# Patient Record
Sex: Female | Born: 1960 | Race: White | Hispanic: No | Marital: Married | Smoking: Never smoker
Health system: Southern US, Community
[De-identification: ages and names within clinical notes are randomized; demographics above are authoritative.]

---

## 1998-11-21 ENCOUNTER — Other Ambulatory Visit: Admission: RE | Admit: 1998-11-21 | Discharge: 1998-11-21 | Payer: Self-pay | Admitting: Obstetrics and Gynecology

## 2001-05-26 ENCOUNTER — Other Ambulatory Visit: Admission: RE | Admit: 2001-05-26 | Discharge: 2001-05-26 | Payer: Self-pay | Admitting: Gynecology

## 2001-05-26 ENCOUNTER — Encounter: Payer: Self-pay | Admitting: Gynecology

## 2001-05-26 ENCOUNTER — Encounter: Admission: RE | Admit: 2001-05-26 | Discharge: 2001-05-26 | Payer: Self-pay | Admitting: Gynecology

## 2002-09-17 ENCOUNTER — Encounter: Payer: Self-pay | Admitting: Gynecology

## 2002-09-17 ENCOUNTER — Encounter: Admission: RE | Admit: 2002-09-17 | Discharge: 2002-09-17 | Payer: Self-pay | Admitting: Gynecology

## 2003-07-11 ENCOUNTER — Other Ambulatory Visit: Admission: RE | Admit: 2003-07-11 | Discharge: 2003-07-11 | Payer: Self-pay | Admitting: Family Medicine

## 2003-10-05 ENCOUNTER — Ambulatory Visit: Admission: RE | Admit: 2003-10-05 | Discharge: 2003-10-05 | Payer: Self-pay | Admitting: *Deleted

## 2005-04-09 ENCOUNTER — Emergency Department (HOSPITAL_COMMUNITY): Admission: EM | Admit: 2005-04-09 | Discharge: 2005-04-09 | Payer: Self-pay | Admitting: Emergency Medicine

## 2005-11-19 ENCOUNTER — Ambulatory Visit: Admission: RE | Admit: 2005-11-19 | Discharge: 2005-11-19 | Payer: Self-pay | Admitting: Pulmonary Disease

## 2008-02-11 ENCOUNTER — Ambulatory Visit: Payer: Self-pay

## 2008-03-04 ENCOUNTER — Ambulatory Visit: Payer: Self-pay

## 2008-03-16 ENCOUNTER — Ambulatory Visit: Payer: Self-pay | Admitting: Oncology

## 2008-10-17 ENCOUNTER — Other Ambulatory Visit: Admission: RE | Admit: 2008-10-17 | Discharge: 2008-10-17 | Payer: Self-pay | Admitting: Obstetrics and Gynecology

## 2010-06-26 ENCOUNTER — Ambulatory Visit (HOSPITAL_COMMUNITY): Admission: RE | Admit: 2010-06-26 | Payer: Self-pay | Source: Home / Self Care | Admitting: Obstetrics & Gynecology

## 2010-09-24 ENCOUNTER — Other Ambulatory Visit: Payer: Self-pay | Admitting: Obstetrics & Gynecology

## 2010-09-24 ENCOUNTER — Ambulatory Visit (HOSPITAL_COMMUNITY)
Admission: RE | Admit: 2010-09-24 | Discharge: 2010-09-25 | Disposition: A | Discharge status: Home / Self Care | Payer: BC Managed Care – HMO | Source: Ambulatory Visit | Attending: Obstetrics & Gynecology | Admitting: Obstetrics & Gynecology

## 2010-09-24 DIAGNOSIS — N8000 Endometriosis of the uterus, unspecified: Secondary | ICD-10-CM | POA: Insufficient documentation

## 2010-09-24 DIAGNOSIS — N920 Excessive and frequent menstruation with regular cycle: Secondary | ICD-10-CM | POA: Insufficient documentation

## 2010-09-24 DIAGNOSIS — Z86718 Personal history of other venous thrombosis and embolism: Secondary | ICD-10-CM | POA: Insufficient documentation

## 2010-09-24 DIAGNOSIS — D252 Subserosal leiomyoma of uterus: Secondary | ICD-10-CM | POA: Insufficient documentation

## 2010-09-24 DIAGNOSIS — N393 Stress incontinence (female) (male): Secondary | ICD-10-CM | POA: Insufficient documentation

## 2010-09-24 LAB — CBC
HCT: 37.5 % (ref 36.0–46.0)
Hemoglobin: 12.4 g/dL (ref 12.0–15.0)
MCH: 30.8 pg (ref 26.0–34.0)
MCHC: 33.1 g/dL (ref 30.0–36.0)
MCV: 93.1 fL (ref 78.0–100.0)
Platelets: 345 10*3/uL (ref 150–400)
RBC: 4.03 MIL/uL (ref 3.87–5.11)
RDW: 12.4 % (ref 11.5–15.5)
WBC: 8 10*3/uL (ref 4.0–10.5)

## 2010-09-24 LAB — PREGNANCY, URINE: Preg Test, Ur: NEGATIVE

## 2010-09-24 LAB — SURGICAL PCR SCREEN
MRSA, PCR: NEGATIVE
Staphylococcus aureus: NEGATIVE

## 2010-09-25 LAB — CBC
HCT: 36.1 % (ref 36.0–46.0)
Platelets: 343 10*3/uL (ref 150–400)
RDW: 12.4 % (ref 11.5–15.5)
WBC: 13.3 10*3/uL — ABNORMAL HIGH (ref 4.0–10.5)

## 2010-10-08 NOTE — Op Note (Signed)
NAME:  JI, [place] NO.:  [id number]  MEDICAL RECORD NO.:  [id number]           PATIENT TYPE:  O  LOCATION:  9311                          FACILITY:  [facility]  PHYSICIAN:  M. Leda Quail, MD  DATE OF BIRTH:  1961-04-15  DATE OF PROCEDURE: DATE OF DISCHARGE:                              OPERATIVE REPORT   PREOPERATIVE DIAGNOSES: 33. A 50 year old G88, P2 married white female with menorrhagia. 2. Adenomyosis. 3. Mild stress urinary incontinence. 4. History of prothrombin gene mutation. 5. Recent C6-C7 diskectomy.  POSTOPERATIVE DIAGNOSES: 68. A 50 year old G37, P2 married white female with menorrhagia. 2. Adenomyosis. 3. Mild stress urinary incontinence. 4. History of prothrombin gene mutation. 5. Recent C6-C7 diskectomy.  PROCEDURE:  [facility] robotic assisted.  SURGEON:  M. Leda Quail, MD  ASSISTANT:  Edwena Felty. Romine, MD and OR staff.  ANESTHESIA:  General endotracheal, Dr. Malen Gauze.  FINDINGS:  Normal uterus, tubes, and ovaries.  SPECIMEN:  Cervix and uterus sent to Pathology.  ESTIMATED BLOOD LOSS:  30 mL.  URINE OUTPUT:  300 mL.  FLUIDS:  1200 mL of LR.  COMPLICATIONS:  None.  INDICATIONS:  Ms. Underhill is a very nice 50 year old G4, P2 married white female with a history of menorrhagia.  She has been considering definitive management with hysterectomy for several months now.  In fact she was posted for surgery back in December and canceled.  She did undergo for evaluation an ultrasound, which showed diffuse adenomyosis of the uterus.  I did not think an ablation was a good option for her. A little over year and a half ago, she had endometrial biopsy that showed proliferative endometrium without any abnormal cells.  She also has a prothrombin gene mutation and cannot be on oral contraceptives. This was discovered after a knee arthroscopy in 2009 resulted in a DVT. She did receive preoperative Lovenox 40 mg.  Her other most recent medical  issue is she had a C6-C7 diskectomy by Dr. Venetia Maxon.  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  Last week her cycle was extremely heavy with clots and essentially dripping blood in the toilet.  I spoke with Dr. Venetia Maxon about her desire to go ahead and proceed with hysterectomy at this time.  The procedure was described to him and he did not have any limitations other than no hyperflexion of the neck with intubation or extubation.  We are going to also try and control her nausea very well because I do not want her to have much emesis if possible.  If she does not, I cannot guarantee this.  Risks and benefits have been discussed with the patient and her husband and they are here and ready to proceed.  PROCEDURE:  The patient was taken to the operating room.  She was placed in a supine position.  Her legs were initially bent to decrease pressure on her low back.  Once she was in a correct positioning on the table and on the beanbag, her legs were positioned in the Ossian stirrups in the high lithotomy position and then lowered to  the low lithotomy position. She was comfortable this entire time.  Her neck was protected very carefully and Dr. Malen Gauze was present when intubation occurred.  A glide scope was used and there was no flexion of the neck that occurred. Intubation well without difficulty.  Her right arm was tucked, her left arm was left out.  Once the correct intubation was confirmed, the abdomen was prepped with ChloraPrep, the inner thighs, vagina, and perineum with Betadine.  3 minutes passed before the patient was draped in a normal sterile fashion.  Legs were lifted to the high lithotomy position.  Attention was turned to the vagina.  A curved Christain [place] was used to retract anteriorly to visualize the cervix.  Anterior lip of the cervix was grasped with single-tooth tenaculum.  The uterus sounds to 9 cm.  A #8 disposable  tip for the Rumi uterine manipulator was obtained. This was placed on the tip of the Rumi manipulator as well as vaginal occlusive device and a medium Koh-ring.  Cervix was dilated up to a #23 and the entire device was passed through cervical canal with the [facility]- ring sitting well around the cervix.  5 mL balloon was inflated to hold the instrument in place.  There was good manipulation of the uterus and the Koh-ring fit around the cervix very well.  Foley catheter was placed to straight drain.  Legs were placed back to the supine position. Before the patient was draped, the bean bag was inflated and butterflied around the shoulders to give good support to the neck and the shoulders. We did place the patient in Trendelenburg without any draping to make sure there was no movement of the table and there was not.  At this point, attention was turned to the abdomen.  Marcaine 0.25% was used to anesthetize above the umbilicus.  A 10-mm skin incision was made with an #11 blade.  Veress needle was obtained.  This was passed through the abdominal cavity without difficulty.  Once peritoneum was popped through, a syringe of normal saline was obtained.  An aspiration was performed.  No blood or fluid was noted.  Fluid was injected and aspiration was performed again.  Then under low flow CO2 gas, pneumoperitoneum was achieved.  Using an #11, 12-bladed trocar, the trocar port passed through the midline without difficulty.  The trocar was removed.  The laparoscope was then used to visualize the pelvis. Port sites were chosen for the 1 and 2 arm of the robot.  These were placed 10 cm lateral to the umbilical incision with an assistance port in the right lower quadrant.  The incisions were numbed with Marcaine and then #11 blade was used to nick the skin at all 3 locations.  Two non-disposable #8 trocar ports were placed and #1 and 2 positioning 10 mm to the right and left of the umbilicus.  Then a 12  disposable non- bladed trocar port placed in the right lower quadrant.  The trocars were removed.  The patient was placed in Trendelenburg.  As soon as the bowel was scooped up out of the pelvis, the Trendelenburg positioning was stopped.  She was in Trendelenburg positioning, but she was not in maximal Trendelenburg.  I did not feel that was necessary for this case and had excellent visualization.  The robot was side docked.  The arms were attached.  An endoscopic scissors with monopolar cautery was placed in arm 1 and bipolar cautery with PK [place] was placed in arm 2.  The  surgeon left the table at this time and went to consult.  Pelvis was surveyed.  Ureters were noted bilaterally.  Starting on the left side, the utero-ovarian pedicle was serially clamped, cauterized, and incised. The left round ligament was serially clamped, cauterized, and incised. The anterior and posterior leaf of the broad ligament was opened and posterior leaf was taken down to the level of the uterosacral ligaments and the anterior leaf was opened across the level of the internal os. The patient's bladder flap was begun anteriorly.  The filmy tissue between the bladder and the cervix noted.  Once it was pushed down the cervix, a white listening tissue of the cervix was noted.  The small bleeders were made hemostatic with monopolar cautery.  The left uterine artery was skeletonized and then at the level of the Koh-ring was serially clamped, back bleeding was clamped and cauterized, and then the uterine artery on left was incised.  In a similar fashion, the right utero-ovarian pedicle was serially clamped, cauterized, and incised. The right round ligament was serially clamped, cauterized, and incised. The anterior and posterior broad ligament was opened.  This was carried to the midline and the bladder flap was completed.  Posterior leaflet was opened down to the level of the uterosacral ligament.  The  uterine artery was skeletonized.  The level of Koh-ring was serially clamped and cauterized and ultimately incised.  Then a colpotomy was performed starting at about 2 o'clock positioning on the cervix working clockwise around the cervix completing the incision with monopolar cautery.  The uterus and cervix were delivered through the vagina.  The vagina was closed with four figure-of-eight sutures of #0 Vicryl.  To close the cuff the 1 and 2 arm instruments were changed to a suture cut needle driver at progress.  At this point, the pelvis was irrigated.  No bleeding was noted.  All pedicles were inspected.  The ureters were noted to peristalses laterally.  An Interceed was placed across the vaginal cuff.  All instruments were removed under direct visualization of the laparoscope.  The robot was undocked and backed away from the table.  The pneumoperitoneum was relieved after the ports were all removed under direct visualization of the laparoscope.  The patient was placed back in supine position.  Several deep breaths were given from the CRNA to help decompress the abdomen.  The fascia was closed at the midline and the assistant's port with #0- Vicryl.  The skin was then closed with subcuticular stitch of #0 Vicryl.  The incisions were cleansed and dried and Dermabond was placed.  The specimen was removed from vagina and a swipe of a sponge stick with a small amount of blood with a little small clot, but nothing active.  Betadine was cleansed off the patient's skin.  The drapes were removed.  She was awakened from anesthesia without difficulty.  The extubation occurred without any problems.  Again there was no hyperflexion of the neck with this. Sponge, lap, needle, and instrument counts were correct x2.  She was placed in supine position.  She was taken to the recovery room in stable condition.  Before transferring her to the recovery room, her Foley catheter was removed.     Lum Keas, MD     MSM/MEDQ  D:  09/24/2010  T:  09/25/2010  Job:  [id number]  cc:   Danae Orleans. Venetia Maxon, M.D. Fax: [phone]  Electronically Signed by Paul Half MD on 10/08/2010 10:19:00 AM

## 2011-01-23 ENCOUNTER — Other Ambulatory Visit (HOSPITAL_COMMUNITY): Payer: Self-pay | Admitting: Neurosurgery

## 2011-01-23 DIAGNOSIS — M542 Cervicalgia: Secondary | ICD-10-CM

## 2011-02-27 ENCOUNTER — Other Ambulatory Visit (HOSPITAL_COMMUNITY): Payer: BC Managed Care – HMO

## 2011-02-27 ENCOUNTER — Other Ambulatory Visit (HOSPITAL_COMMUNITY): Payer: Self-pay | Admitting: Neurosurgery

## 2011-02-27 DIAGNOSIS — M542 Cervicalgia: Secondary | ICD-10-CM

## 2011-02-28 ENCOUNTER — Ambulatory Visit (HOSPITAL_COMMUNITY)
Admission: RE | Admit: 2011-02-28 | Discharge: 2011-02-28 | Disposition: A | Discharge status: Home / Self Care | Payer: BC Managed Care – HMO | Source: Ambulatory Visit | Attending: Neurosurgery | Admitting: Neurosurgery

## 2011-02-28 DIAGNOSIS — M542 Cervicalgia: Secondary | ICD-10-CM

## 2011-02-28 DIAGNOSIS — M5090 Cervical disc disorder, unspecified, unspecified cervical region: Secondary | ICD-10-CM | POA: Insufficient documentation

## 2011-02-28 DIAGNOSIS — M47812 Spondylosis without myelopathy or radiculopathy, cervical region: Secondary | ICD-10-CM | POA: Insufficient documentation

## 2011-08-13 ENCOUNTER — Other Ambulatory Visit (HOSPITAL_COMMUNITY): Payer: Self-pay | Admitting: Neurosurgery

## 2011-08-13 DIAGNOSIS — M542 Cervicalgia: Secondary | ICD-10-CM

## 2011-08-30 ENCOUNTER — Ambulatory Visit (HOSPITAL_COMMUNITY)
Admission: RE | Admit: 2011-08-30 | Discharge: 2011-08-30 | Disposition: A | Discharge status: Home / Self Care | Payer: Self-pay | Source: Ambulatory Visit | Attending: Neurosurgery | Admitting: Neurosurgery

## 2011-08-30 DIAGNOSIS — Z981 Arthrodesis status: Secondary | ICD-10-CM | POA: Insufficient documentation

## 2011-08-30 DIAGNOSIS — M542 Cervicalgia: Secondary | ICD-10-CM | POA: Insufficient documentation

## 2012-11-25 ENCOUNTER — Telehealth: Payer: Self-pay | Admitting: Hematology & Oncology

## 2012-11-25 NOTE — Telephone Encounter (Signed)
Pt called wanting to see Dr. Myna Hidalgo. She saw Dr. Truett Perna 5 years ago. She is having surgery 01-13-13 and needs clearance. She said her MD is going to fax referral. She is aware to call me by Friday if she hasn't heard from me.

## 2012-11-26 ENCOUNTER — Telehealth: Payer: Self-pay | Admitting: Hematology & Oncology

## 2012-11-26 NOTE — Telephone Encounter (Signed)
Left pt message to call and schedule appointment °

## 2012-11-27 ENCOUNTER — Telehealth: Payer: Self-pay | Admitting: Hematology & Oncology

## 2012-11-27 NOTE — Telephone Encounter (Signed)
Left pt message to call and schedule appointment °

## 2012-11-27 NOTE — Telephone Encounter (Signed)
Faxed unable to reach pt to schedule appointment to Dr. Benjaman Pott office

## 2012-12-28 ENCOUNTER — Other Ambulatory Visit (HOSPITAL_BASED_OUTPATIENT_CLINIC_OR_DEPARTMENT_OTHER): Payer: Managed Care, Other (non HMO) | Admitting: Lab

## 2012-12-28 ENCOUNTER — Ambulatory Visit: Payer: Managed Care, Other (non HMO)

## 2012-12-28 ENCOUNTER — Ambulatory Visit (HOSPITAL_BASED_OUTPATIENT_CLINIC_OR_DEPARTMENT_OTHER): Payer: Managed Care, Other (non HMO) | Admitting: Hematology & Oncology

## 2012-12-28 VITALS — BP 122/60 | HR 64 | Temp 98.1°F | Resp 16 | Ht 68.0 in | Wt 199.0 lb

## 2012-12-28 DIAGNOSIS — I82409 Acute embolism and thrombosis of unspecified deep veins of unspecified lower extremity: Secondary | ICD-10-CM

## 2012-12-28 DIAGNOSIS — I82402 Acute embolism and thrombosis of unspecified deep veins of left lower extremity: Secondary | ICD-10-CM

## 2012-12-28 LAB — CBC WITH DIFFERENTIAL (CANCER CENTER ONLY)
Eosinophils Absolute: 0.2 10*3/uL (ref 0.0–0.5)
MONO#: 0.5 10*3/uL (ref 0.1–0.9)
NEUT#: 2.4 10*3/uL (ref 1.5–6.5)
Platelets: 283 10*3/uL (ref 145–400)
RBC: 4.16 10*6/uL (ref 3.70–5.32)
WBC: 5.7 10*3/uL (ref 3.9–10.0)

## 2012-12-28 MED ORDER — RIVAROXABAN 10 MG PO TABS: 10.0000 mg | ORAL_TABLET | Freq: Every day | ORAL | Status: DC

## 2012-12-28 NOTE — Progress Notes (Signed)
This office note has been dictated.

## 2012-12-29 NOTE — Progress Notes (Signed)
CC:   Robert A. Thurston Hole, M.D. Robert A. Nicholos Johns, M.D.  DIAGNOSES: 1. Prothrombin gene mutation -  heterozygous. 2. Patient to need left knee replacement.  HISTORY OF PRESENT ILLNESS:  Ms. Forbush is a very charming 52 year old white female.  She has a history of a prothrombin II gene mutation.  She actually had a DVT after a left knee surgery back in August of 2009. She had a left lower extremity DVT.  Again, she is heterozygous for the prothrombin gene mutation.  This is in her family.  She has two daughters who have it.  I think a niece has it. Her mother also has it. Ms. Schaffert has also had two miscarriages.  Again, she was placed on Coumadin for 3 months.  She did not like being on Coumadin.  In 2012, she had a neck fusion and also hysterectomy.  She was on Lovenox after these procedures.  She now needs to have a total left knee replacement.  Robert A. Thurston Hole, M.D. will be doing this.  It may be set up for September.  Ms. Lansdale just wanted to make sure that everything was okay so that she has a minimal risk of blood clot after the procedure.  Back in 2009, she underwent a fairly extensive hypercoagulable workup that was all negative, outside of the prothrombin II gene mutation.  At that time, she had a left a gastrocnemius vein thrombus.  She has not been able to exercise that much because of the left knee. 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  She says that this bothers her knee.  She is not on aspirin.  I think she needs to be on baby aspirin.  I told to take 81 mg a day of baby aspirin. I also told her to take folic acid.  She has had no bleeding.  She had a hysterectomy but had her ovaries left in place.  She is postmenopausal.  She does have a bulging disk in her lower back.  She is going to see a chiropractor for this.  I do not have any problems with this.  She has had no rashes.  She does not smoke.  She has had no headache.  There has been no cough or  shortness of breath.  PAST MEDICAL HISTORY:  Unremarkable outside of multiple surgeries for orthopedic issues.  She also has had the hysterectomy.  She has had two miscarriages.  ALLERGIES: 1. Codeine. 2. Pseudoephedrine.  MEDICATIONS:  Flexeril 10 mg p.o. every 8 hours p.r.n.  Norco (10/325) 1 p.o. every 12 hours p.r.n., Naprosyn 250 mg p.o. daily p.r.n., Advil as needed for sleep.  SOCIAL HISTORY:  Negative for tobacco use.  She has rare alcohol use. There is no obvious occupational exposures.  Again, she is a traveling Engineer, civil (consulting).  She sees patients with multiple sclerosis.  FAMILY HISTORY:  Remarkable for her mother having had blood clot, miscarriages.  Her two daughters have the prothrombin gene mutation.  REVIEW OF SYSTEMS:  As stated in history present illness.  No additional findings are noted on 12-system review.  PHYSICAL EXAMINATION:  General:  This is a well-developed, well- nourished white female in no obvious distress.  Vital signs: Temperature of 98.1, pulse 64, respiratory rate 16, blood pressure 122/60.  Weight is 199.  Head and neck:  Normocephalic, atraumatic skull.  There are no ocular or oral lesions.  There are no palpable cervical or supraclavicular lymph nodes.  Lungs:  Clear bilaterally. Cardiac:  Regular  rate and rhythm with a normal S1 and S2.  There are no murmurs, rubs, or bruits.  Abdomen:  Soft with good bowel sounds.  There is no palpable abdominal mass.  There is no fluid wave.  There is no palpable hepatosplenomegaly.  Back:  No tenderness over the spine, ribs, or hips.  Extremities:  Show no clubbing, cyanosis or edema.  There may be some slight swelling of the left knee.  She has good pulses in her distal extremities.  No palpable venous cords are noted in her legs. Skin:  No rashes, ecchymosis, or petechia.  Neurological:  Shows no focal neurological deficits.  LABORATORY STUDIES:  White cell count 5.7, hemoglobin 13.3, hematocrit 39.6,  platelet count 283.  IMPRESSION:  Ms. Lievanos is a very charming 52 year old white female with a history of prothrombin II gene mutation.  She did have a deep venous thrombosis of the left lower leg after knee surgery back in 2009.  She definitely will need postoperative anticoagulation.  However, I do not think we need to put her on full-dose anticoagulation.  Now that we have Xarelto, I think this would be a good way to try to treat her postoperatively.  I believe 10 mg a day would be appropriate for her.  I probably would keep her on the Xarelto for 6 weeks after surgery so that she is able to ambulate well.  I do not think she needs any anticoagulation preop.  Again, I do believe that she needs to be on baby aspirin.  I told her to take 81 mg a day.  I told her to stop this 5 days before surgery.  Ms. Medinger thinks that she will be able to have this surgery in September. We will hopefully have this done.  I told her to let us know about when her surgery will be.  As such, we can then get her back a month or so afterwards for followup.  I spent about an hour with this lady.  Again, she is very, very nice.    ______________________________ Josph Macho, M.D. PRE/MEDQ  D:  12/28/2012  T:  12/29/2012  Job:  [id number]

## 2013-01-04 ENCOUNTER — Other Ambulatory Visit (HOSPITAL_COMMUNITY): Payer: Self-pay

## 2013-01-11 ENCOUNTER — Inpatient Hospital Stay: Admit: 2013-01-11 | Payer: Self-pay | Admitting: Orthopedic Surgery

## 2013-01-11 SURGERY — ARTHROPLASTY, KNEE, TOTAL
Anesthesia: General | Laterality: Left

## 2013-03-01 ENCOUNTER — Inpatient Hospital Stay: Admit: 2013-03-01 | Payer: Managed Care, Other (non HMO) | Admitting: Orthopedic Surgery

## 2013-03-01 SURGERY — ARTHROPLASTY, KNEE, TOTAL
Anesthesia: General | Laterality: Left

## 2013-03-05 IMAGING — CT CT CERVICAL SPINE W/O CM
4 series · 17 of 33 positions shown, 19 images · non-contrast
Comparison: 02/28/2011

CLINICAL DATA: [employer] cervical spine protocol.  Surgery 1 year
ago.

CT CERVICAL SPINE WITHOUT CONTRAST
TECHNIQUE: Multidetector CT imaging of the cervical spine was
performed. Multiplanar CT image reconstructions were also
generated.

[Series 2: alphatec c spine · axial · 0.31mm/px · z∈[-122,-26]mm · 6 of 257 slices shown]
[im 26/257  bone]
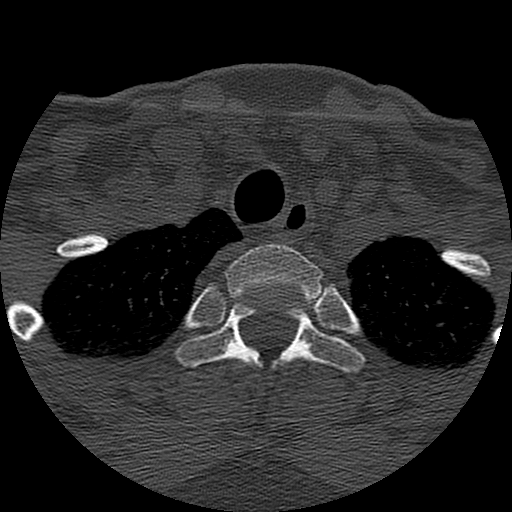
[im 52/257  bone]
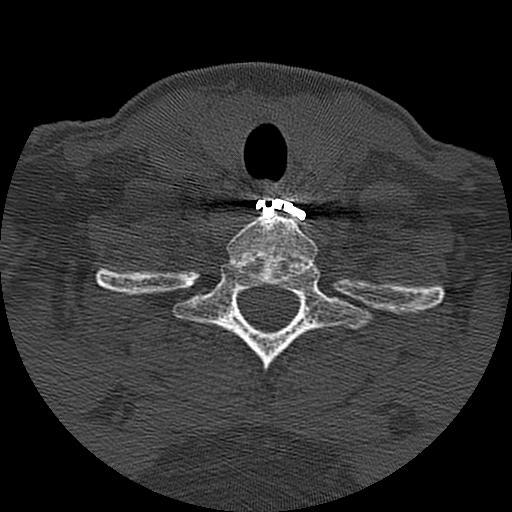
[im 77/257  bone]
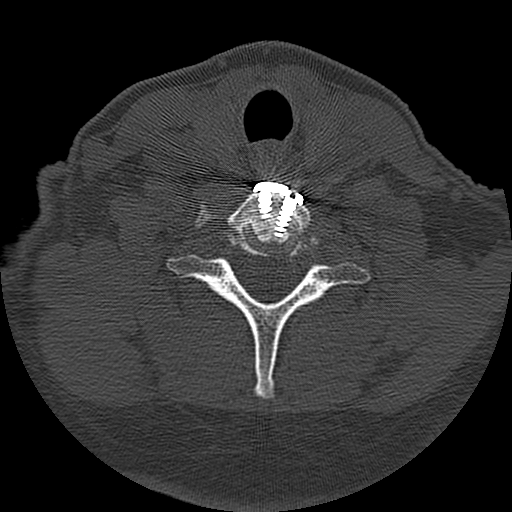
[im 103/257  bone]
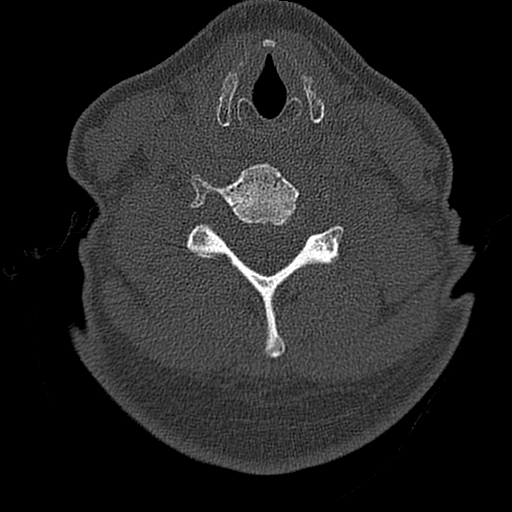
[im 154/257  bone]
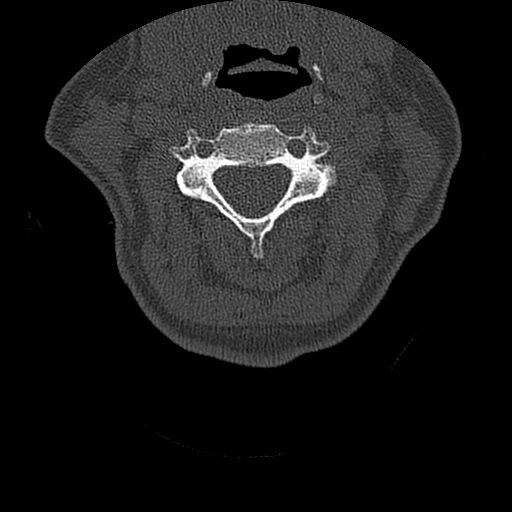
[im 180/257  bone]
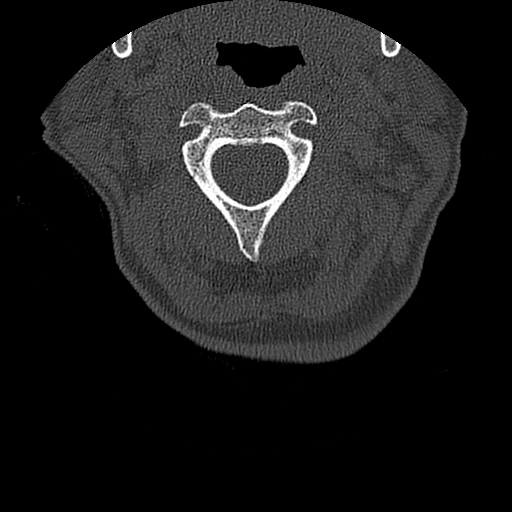

[Series 200: reformatted axials · axial · 0.32mm/px · z∈[-163,+30]mm · 3 of 82 slices shown, 4 images]
[im 1/82  soft-tissue]
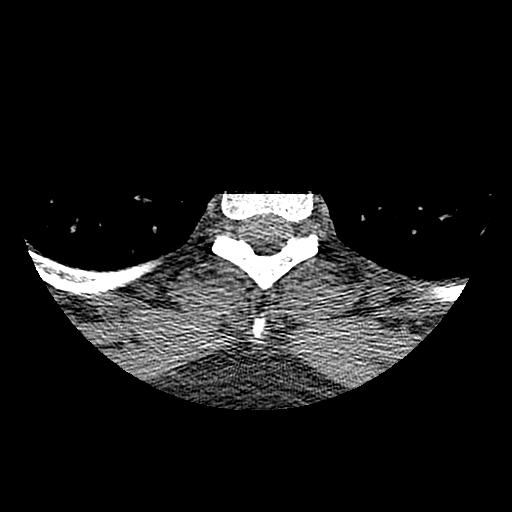
[im 1/82  bone]
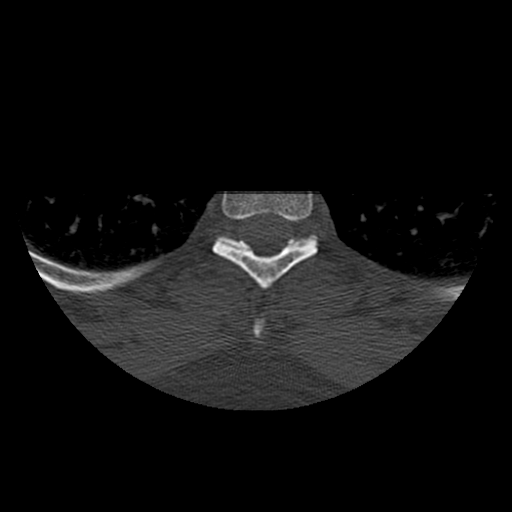
[im 41/82  bone]
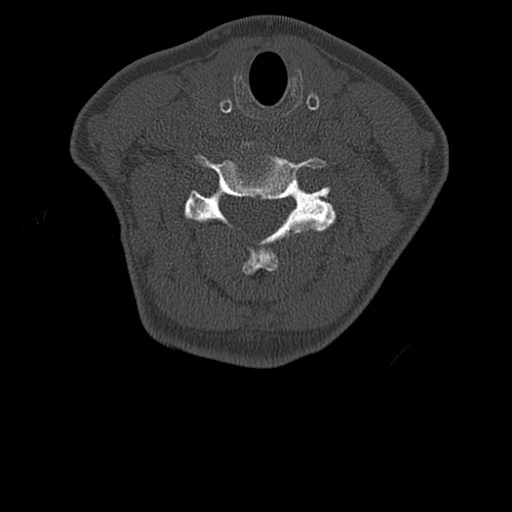
[im 82/82  bone]
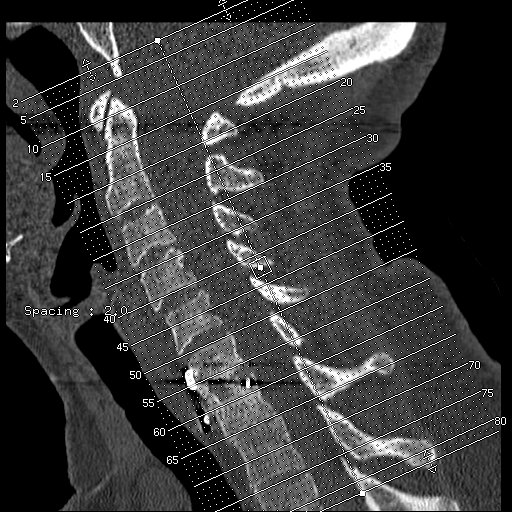

[Series 201: coronals · coronal · 0.32mm/px · 3 of 38 slices shown]
[im 8/38  bone]
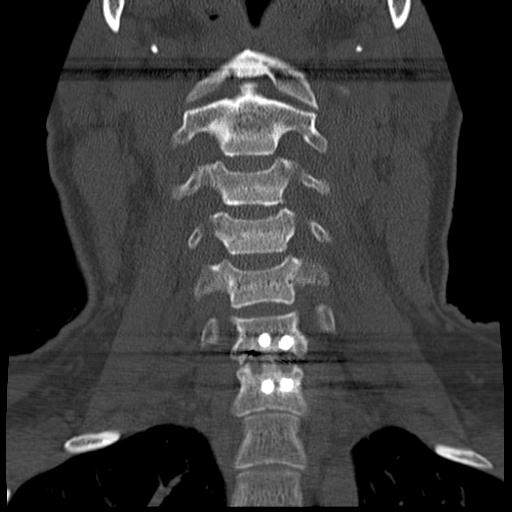
[im 15/38  bone]
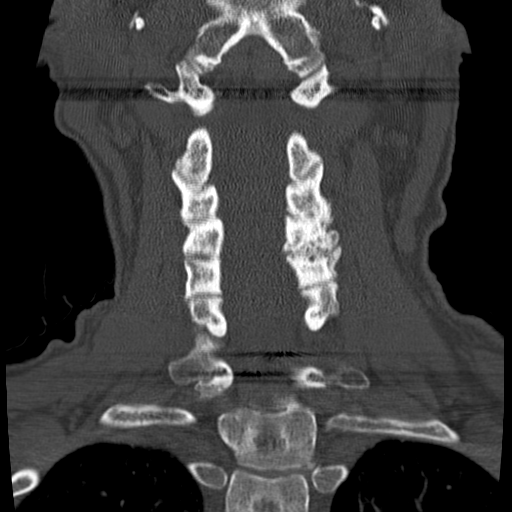
[im 23/38  bone]
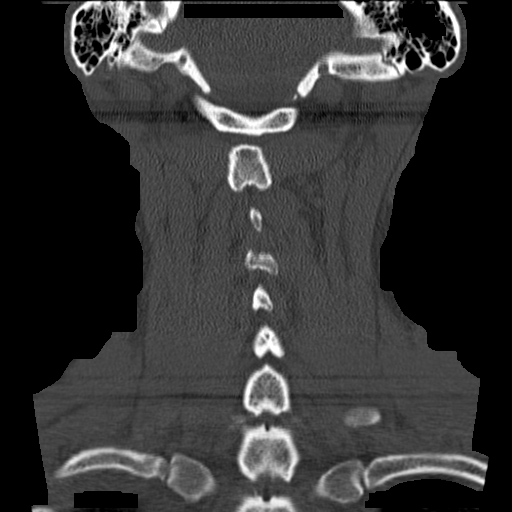

[Series 202: sagittals · sagittal · 0.32mm/px · 5 of 34 slices shown, 6 images]
[im 12/34  bone]
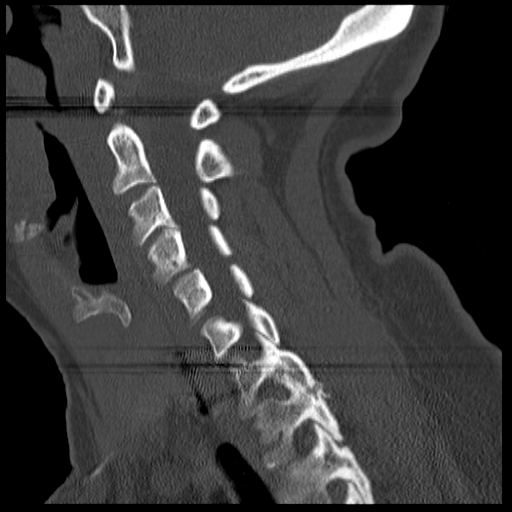
[im 14/34  bone]
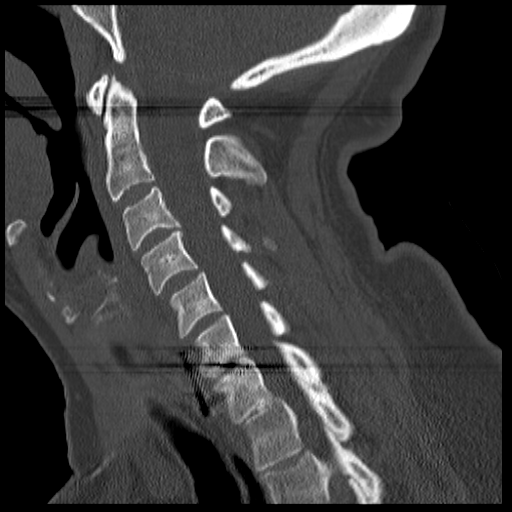
[im 17/34  soft-tissue]
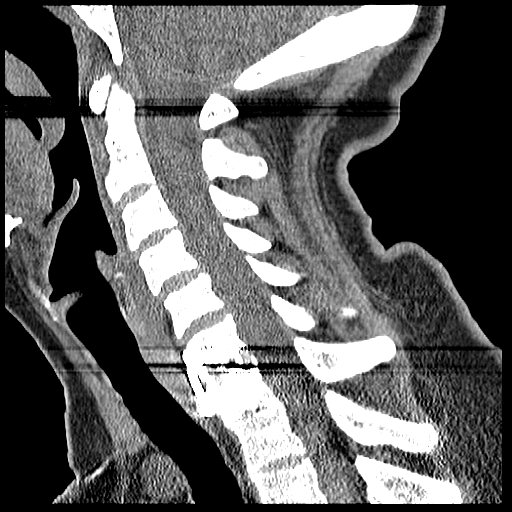
[im 17/34  bone]
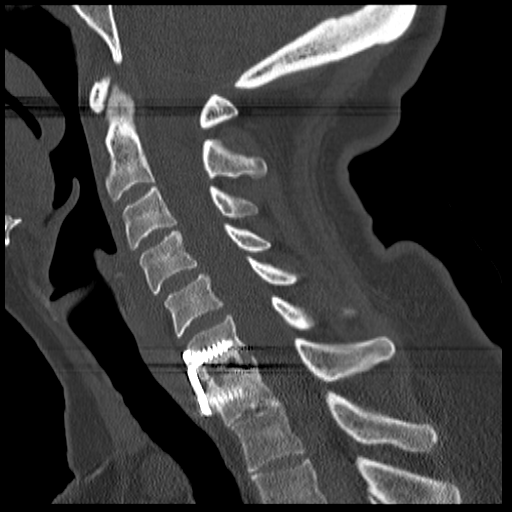
[im 20/34  bone]
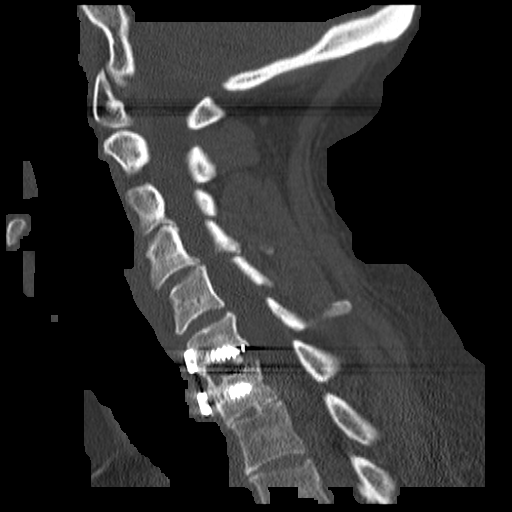
[im 23/34  bone]
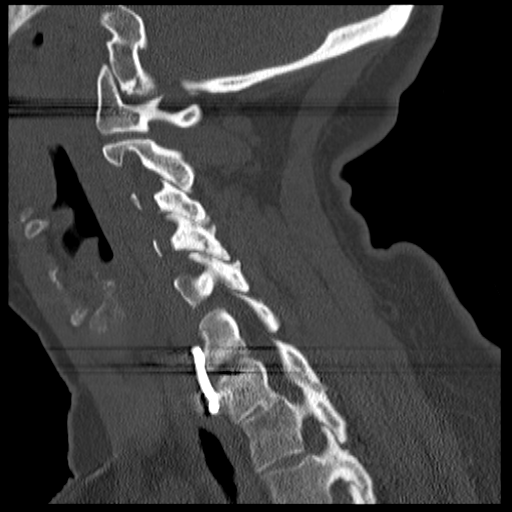

[17 of 33 positions shown; findings below may reference images not displayed]

FINDINGS: The foramen magnum is widely patent.  There is ordinary
mild degenerative changes at the C1-2 articulation that has not
visibly progressed.

C2-3:  Normal interspace.

C3-4:  Mild spondylosis with small uncovertebral osteophytes.
Facet degeneration on the left.  Mild foraminal encroachment on the
left by osteophytes.  No apparent change.

C4-5:  Uncovertebral degeneration.  1 mm of anterolisthesis because
of facet arthropathy on the left.  Osteophytic encroachment upon
the foramen on the left.  No apparent change since last year's
study.

C5-6:  No uncovertebral disease.  Mild facet degeneration left
worse than right.  No significant stenosis.  No apparent change.

C6-7:  Previous anterior cervical discectomy and fusion.  Fusion
appears solid.  Height of the interbody material appears well
maintained.  Anterior plate and screws appear well positioned.  No
evidence of screw loosening or motion.  No facet arthropathy.
Canal and foramina widely patent.

C7-T1:  Chronic fusion of the facets and across the disc space.
Chronic 1 mm of anterolisthesis.  Fused in this position.  Canal
and foramina appear widely patent.

T2-3:  Unremarkable interspace.
IMPRESSION: No change or complicating features since the previous study.  Solid
fusion at the C6-7 with wide patency of the canal and foramina.

Chronic fusion at the C7-T1 level.

Chronic uncovertebral and facet degenerative changes above the
fusion levels.  No apparent change since last year.

## 2013-11-10 ENCOUNTER — Telehealth: Payer: Self-pay | Admitting: *Deleted

## 2013-11-10 NOTE — Telephone Encounter (Signed)
Returned patient's phone call. Informed pt that Dr Marin Olp is not concerned with her taking Celebrex, but she needs to take it with food.

## 2014-08-29 ENCOUNTER — Encounter: Payer: Self-pay | Admitting: Podiatry

## 2014-08-29 ENCOUNTER — Ambulatory Visit (INDEPENDENT_AMBULATORY_CARE_PROVIDER_SITE_OTHER): Payer: Managed Care, Other (non HMO)

## 2014-08-29 ENCOUNTER — Other Ambulatory Visit: Payer: Self-pay | Admitting: Podiatry

## 2014-08-29 ENCOUNTER — Ambulatory Visit (INDEPENDENT_AMBULATORY_CARE_PROVIDER_SITE_OTHER): Payer: Managed Care, Other (non HMO) | Admitting: Podiatry

## 2014-08-29 DIAGNOSIS — Q786 Multiple congenital exostoses: Secondary | ICD-10-CM | POA: Diagnosis not present

## 2014-08-29 DIAGNOSIS — M79671 Pain in right foot: Secondary | ICD-10-CM

## 2014-08-29 DIAGNOSIS — S93401A Sprain of unspecified ligament of right ankle, initial encounter: Secondary | ICD-10-CM

## 2014-08-29 DIAGNOSIS — M898X0 Other specified disorders of bone, multiple sites: Secondary | ICD-10-CM

## 2014-08-29 NOTE — Progress Notes (Signed)
   Subjective:    Patient ID: Laurie Johnston, female    DOB: [birth year], 54 y.o.   MRN: [id number]  HPI  PT STATED INJURED/SPRAIN IT AND IT BEEN SWOLLEN AND PAINFUL FOR 6 WEEKS. THE ANKLE IS GETTING WORSE AND THE END OF THE DAY IT GET SWOLLEN. THE ANKLE GET AGGRAVATED WALKING DOWN STAIRS. TRIED ICE, STRAPPING, AND ELEVATION.  Review of Systems  All other systems reviewed and are negative.      Objective:   Physical Exam        Assessment & Plan:

## 2014-08-30 NOTE — Progress Notes (Signed)
Subjective:     Patient ID: Laurie Johnston, female   DOB: October 04, 1960, 54 y.o.   MRN: [id number]  HPI patient points to the right ankle stating that she sprained it about 6 weeks ago and it's been really bothering her and swelling and making it hard to walk. Also states that she has a bone spur on her right big toe and she had dropped something on it years ago and it's very bothersome and makes it hard to walk comfortably   Review of Systems  All other systems reviewed and are negative.      Objective:   Physical Exam  Constitutional: She is oriented to person, place, and time.  Cardiovascular: Intact distal pulses.   Musculoskeletal: Normal range of motion.  Neurological: She is oriented to person, place, and time.  Skin: Skin is warm.  Nursing note and vitals reviewed.  neurovascular status intact with muscle strength adequate range of motion within normal limits. Patient has on the medial side of the right big toe at the interphalangeal joint large exostotic area that's painful when pressed and has a edematous right ankle lateral side with no indications of excessive inversion and quite a bit of pain in the sinus tarsi and along the lateral ankle gutter     Assessment:     Bone spur with a detached Of the right interphalangeal joint hallux and also a sprained ankle right with inflammatory changes and sinus tarsitis which has developed    Plan:     H&P and x-rays reviewed and we discussed conditions. Due to the long-term nature the right big toe she like to get that corrected and at the same time I recommended that we can rest the ankle right and also consider a possible sinus tarsi injection. Patient wants to pursue this course and I allowed her to read a consent form concerning removal of the spur bone chip formation and also possible injection of the right subtalar joint. Patient understands all risk and alternative treatments and signs consent form after review and is scheduled  for outpatient surgery. Dispensed air fracture walker to immobilize the foot and ankle during the postoperative period

## 2014-09-06 ENCOUNTER — Encounter: Payer: Self-pay | Admitting: Podiatry

## 2014-09-06 DIAGNOSIS — M25774 Osteophyte, right foot: Secondary | ICD-10-CM | POA: Diagnosis not present

## 2014-09-06 DIAGNOSIS — M7751 Other enthesopathy of right foot: Secondary | ICD-10-CM | POA: Diagnosis not present

## 2014-09-12 ENCOUNTER — Ambulatory Visit (INDEPENDENT_AMBULATORY_CARE_PROVIDER_SITE_OTHER): Payer: Managed Care, Other (non HMO)

## 2014-09-12 ENCOUNTER — Ambulatory Visit (INDEPENDENT_AMBULATORY_CARE_PROVIDER_SITE_OTHER): Payer: Managed Care, Other (non HMO) | Admitting: Podiatry

## 2014-09-12 ENCOUNTER — Encounter: Payer: Self-pay | Admitting: Podiatry

## 2014-09-12 VITALS — BP 132/91 | HR 70

## 2014-09-12 DIAGNOSIS — Z9889 Other specified postprocedural states: Secondary | ICD-10-CM

## 2014-09-12 DIAGNOSIS — S93401A Sprain of unspecified ligament of right ankle, initial encounter: Secondary | ICD-10-CM

## 2014-09-12 NOTE — Progress Notes (Signed)
Subjective:     Patient ID: Laurie Johnston, female   DOB: 20-Jul-1960, 54 y.o.   MRN: [id number]  HPI patient states that she's doing well and that her ankle also feels quite a bit better than it was   Review of Systems     Objective:   Physical Exam One week after foot surgery right with incision site on the right hallux doing well with stitches intact and wound edges well coapted and ankle pain that has reduced quite a bit upon palpation secondary to injection at surgery    Assessment:     Doing well post removal of bone spur right big toe and ankle injection right    Plan:     Reapplied sterile dressing reviewed x-rays and dispensed surgical shoe with instructions on usage versus the boot. Reappoint to recheck 2 weeks for suture removal or earlier if any issues should occur

## 2014-09-14 NOTE — Progress Notes (Signed)
DOS Right Tarsal Exostectomy and injection right ankle.

## 2014-09-26 ENCOUNTER — Ambulatory Visit (INDEPENDENT_AMBULATORY_CARE_PROVIDER_SITE_OTHER): Payer: Managed Care, Other (non HMO) | Admitting: Podiatry

## 2014-09-26 DIAGNOSIS — Z9889 Other specified postprocedural states: Secondary | ICD-10-CM

## 2014-09-28 NOTE — Progress Notes (Signed)
Subjective:     Patient ID: Laurie Johnston, female   DOB: October 19, 1960, 54 y.o.   MRN: [id number]  HPI patient presents for suture removal stating her big toe is doing fine   Review of Systems     Objective:   Physical Exam Neurovascular status intact wound edges well coapted right hallux with mild edema but obvious removal of the prominence    Assessment:     Doing well post surgery right foot    Plan:     Stitches removed wound edges coapted well and dressing applied. Gradual return soft shoe at this time and reappoint as needed

## 2016-06-06 ENCOUNTER — Encounter: Payer: Self-pay | Admitting: Nurse Practitioner

## 2016-12-05 DIAGNOSIS — L237 Allergic contact dermatitis due to plants, except food: Secondary | ICD-10-CM | POA: Diagnosis not present

## 2017-07-25 ENCOUNTER — Other Ambulatory Visit: Payer: Self-pay | Admitting: Family

## 2017-07-25 DIAGNOSIS — Z86718 Personal history of other venous thrombosis and embolism: Secondary | ICD-10-CM

## 2017-07-28 ENCOUNTER — Inpatient Hospital Stay: Payer: 59 | Attending: Family | Admitting: Family

## 2017-07-28 ENCOUNTER — Inpatient Hospital Stay: Payer: 59

## 2017-07-28 ENCOUNTER — Other Ambulatory Visit: Payer: Self-pay

## 2017-07-28 ENCOUNTER — Encounter: Payer: Self-pay | Admitting: Family

## 2017-07-28 VITALS — BP 147/66 | HR 67 | Temp 98.4°F | Resp 16 | Wt 223.0 lb

## 2017-07-28 DIAGNOSIS — D6852 Prothrombin gene mutation: Secondary | ICD-10-CM

## 2017-07-28 DIAGNOSIS — Z86718 Personal history of other venous thrombosis and embolism: Secondary | ICD-10-CM

## 2017-07-28 DIAGNOSIS — Z7901 Long term (current) use of anticoagulants: Secondary | ICD-10-CM | POA: Diagnosis not present

## 2017-07-28 LAB — CMP (CANCER CENTER ONLY)
ALK PHOS: 82 U/L (ref 26–84)
ALT: 26 U/L (ref 0–55)
AST: 25 U/L (ref 5–34)
Albumin: 3.9 g/dL (ref 3.5–5.0)
Anion gap: 6 (ref 5–15)
BILIRUBIN TOTAL: 0.6 mg/dL (ref 0.2–1.2)
BUN: 17 mg/dL (ref 7–22)
CHLORIDE: 106 mmol/L (ref 98–108)
CO2: 30 mmol/L (ref 18–33)
Calcium: 9.5 mg/dL (ref 8.0–10.3)
Creatinine: 0.6 mg/dL (ref 0.60–1.10)
Glucose, Bld: 111 mg/dL (ref 73–118)
POTASSIUM: 3.5 mmol/L (ref 3.3–4.7)
Sodium: 142 mmol/L (ref 128–145)
TOTAL PROTEIN: 8.1 g/dL (ref 6.4–8.1)

## 2017-07-28 LAB — CBC WITH DIFFERENTIAL (CANCER CENTER ONLY)
Basophils Absolute: 0 10*3/uL (ref 0.0–0.1)
Basophils Relative: 0 %
Eosinophils Absolute: 0.1 10*3/uL (ref 0.0–0.5)
Eosinophils Relative: 2 %
HEMATOCRIT: 40.9 % (ref 34.8–46.6)
HEMOGLOBIN: 13.5 g/dL (ref 11.6–15.9)
LYMPHS ABS: 2.8 10*3/uL (ref 0.9–3.3)
Lymphocytes Relative: 45 %
MCH: 31 pg (ref 26.0–34.0)
MCHC: 33 g/dL (ref 32.0–36.0)
MCV: 94 fL (ref 81.0–101.0)
MONOS PCT: 8 %
Monocytes Absolute: 0.5 10*3/uL (ref 0.1–0.9)
NEUTROS ABS: 2.7 10*3/uL (ref 1.5–6.5)
NEUTROS PCT: 45 %
Platelet Count: 302 10*3/uL (ref 145–400)
RBC: 4.35 MIL/uL (ref 3.70–5.32)
RDW: 12.3 % (ref 11.1–15.7)
WBC Count: 6.1 10*3/uL (ref 3.9–10.0)

## 2017-07-28 LAB — D-DIMER, QUANTITATIVE: D-Dimer, Quant: 0.46 ug/mL-FEU (ref 0.00–0.50)

## 2017-07-28 NOTE — Progress Notes (Signed)
Hematology and Oncology Follow Up Visit  Laurie Johnston [id number] 05/05/1961 57 y.o. 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  She has a great deal of pain in the left knee and some swelling. She has pain and tingling in the right lower back and hip due to trochanteric bursitis.  No fever, chills, n/v, cough, rash, dizziness, SOB, chest pain, abdominal pain or changes in bowel or bladder habits.  No episodes of bleeding, no bruising or petechiae. No lymphadenopathy found on exam.  She has hot flashes at night. She will occasionally have palpitations after drinking caffeine.  She has maintained a good appetite and is staying well hydrated. Her weight is stable.   ECOG Performance Status: 1 - Symptomatic but completely ambulatory  Medications:  Allergies as of 07/28/2017   No Known Allergies     Medication List        Accurate as of 07/28/17  2:49 PM. Always use your most recent med list.          acetaminophen 500 MG tablet Commonly known as:  TYLENOL Take 500 mg by mouth as needed for pain.   FLEXERIL PO Take 10 mg by mouth as needed.   HYDROcodone-acetaminophen 10-325 MG tablet Commonly known as:  NORCO Take 1 tablet by mouth as needed for pain.   HYDROcodone-acetaminophen 5-325 MG tablet Commonly known as:  NORCO/VICODIN Take 1 tablet by mouth every 4 (four) hours as needed for moderate pain.   ibuprofen 200 MG tablet Commonly known as:  ADVIL,MOTRIN Take 200 mg by mouth as needed for pain.   NAPROSYN 250 MG tablet Generic drug:  naproxen Take 250 mg by mouth as needed.   rivaroxaban 10 MG Tabs tablet Commonly known as:  XARELTO Take 1 tablet (10 mg total) by mouth daily.        Allergies: No Known Allergies  Past Medical History, Surgical history, Social history, and Family History were reviewed and updated.  Review of Systems: All other 10 point review of systems is negative.   Physical Exam:  vitals were not taken for this visit.   Wt Readings from Last 3 Encounters:  12/28/12 199 lb (90.3 kg)    Ocular: Sclerae unicteric, pupils equal, round and reactive to light Ear-nose-throat: Oropharynx clear, dentition fair Lymphatic: No cervical, supraclavicular or axillary adenopathy Lungs no rales or rhonchi, good excursion bilaterally Heart regular rate and rhythm, no murmur appreciated Abd soft, nontender, positive bowel sounds, no liver or spleen tip palpated on exam, no fluid wave  MSK no focal spinal tenderness, no joint edema Neuro: non-focal, well-oriented, appropriate affect Breasts: Deferred   Lab Results  Component Value Date   WBC 6.1 07/28/2017   HGB 13.3 12/28/2012   HCT 40.9 07/28/2017   MCV 94.0 07/28/2017   PLT 302 07/28/2017   No results found for: FERRITIN, IRON, TIBC, UIBC, IRONPCTSAT Lab Results  Component Value Date   RBC 4.35 07/28/2017   No results found for: KPAFRELGTCHN, LAMBDASER, KAPLAMBRATIO No results found for: IGGSERUM, IGA, IGMSERUM No results found for: TOTALPROTELP, ALBUMINELP, A1GS, A2GS, BETS, BETA2SER, GAMS, MSPIKE, SPEI   Chemistry   No results found for: NA, K, CL, CO2, BUN, CREATININE, GLU No results found for: CALCIUM, ALKPHOS, AST,  ALT, BILITOT    Impression and Plan: Laurie Johnston is a very pleasant 57 yo caucasian female with prothrombin gene mutation, heterozygous and history of left lower extremity DVT.  She is scheduled for a total left knee replacement with Dr. Wynelle Link on March 19th.  We will have her start Xarelto 10 mg PO daily 10 days prior to her surgery. She will stop taking on March 17th and resume the day after her surgery on March 20th.  She verbalized understanding and agreement with the  plan.  We will go ahead and plan to see her back in May for follow-up.  She promises to contact our office with any questions or concerns. We can certainly see her sooner if need be.   Laverna Peace, NP 2/4/20192:49 PM

## 2017-09-02 ENCOUNTER — Other Ambulatory Visit: Payer: Self-pay

## 2017-09-02 MED ORDER — RIVAROXABAN 10 MG PO TABS: 10.0000 mg | ORAL_TABLET | Freq: Every day | ORAL | 3 refills | Status: DC

## 2017-10-29 ENCOUNTER — Inpatient Hospital Stay: Payer: 59

## 2017-10-29 ENCOUNTER — Encounter: Payer: Self-pay | Admitting: Family

## 2017-10-29 ENCOUNTER — Inpatient Hospital Stay: Payer: 59 | Attending: Family | Admitting: Family

## 2017-10-29 ENCOUNTER — Other Ambulatory Visit: Payer: Self-pay

## 2017-10-29 VITALS — BP 146/65 | HR 64 | Temp 98.0°F | Resp 16 | Wt 213.0 lb

## 2017-10-29 DIAGNOSIS — D6852 Prothrombin gene mutation: Secondary | ICD-10-CM | POA: Diagnosis not present

## 2017-10-29 DIAGNOSIS — Z86718 Personal history of other venous thrombosis and embolism: Secondary | ICD-10-CM

## 2017-10-29 DIAGNOSIS — Z96652 Presence of left artificial knee joint: Secondary | ICD-10-CM

## 2017-10-29 LAB — CMP (CANCER CENTER ONLY)
ALT: 16 U/L (ref 0–55)
ANION GAP: 8 (ref 3–11)
AST: 19 U/L (ref 5–34)
Albumin: 4.5 g/dL (ref 3.5–5.0)
Alkaline Phosphatase: 94 U/L (ref 40–150)
BUN: 18 mg/dL (ref 7–26)
CHLORIDE: 106 mmol/L (ref 98–109)
CO2: 26 mmol/L (ref 22–29)
Calcium: 10 mg/dL (ref 8.4–10.4)
Creatinine: 0.82 mg/dL (ref 0.60–1.10)
GFR, Estimated: >60 mL/min (ref >60)
Glucose, Bld: 132 mg/dL (ref 70–140)
POTASSIUM: 3.9 mmol/L (ref 3.5–5.1)
SODIUM: 140 mmol/L (ref 136–145)
Total Bilirubin: 0.5 mg/dL (ref 0.2–1.2)
Total Protein: 8 g/dL (ref 6.4–8.3)

## 2017-10-29 LAB — CBC WITH DIFFERENTIAL (CANCER CENTER ONLY)
Basophils Absolute: 0 10*3/uL (ref 0.0–0.1)
Basophils Relative: 0 %
Eosinophils Absolute: 0.1 10*3/uL (ref 0.0–0.5)
Eosinophils Relative: 3 %
HCT: 40.1 % (ref 34.8–46.6)
HEMOGLOBIN: 13.1 g/dL (ref 11.6–15.9)
LYMPHS ABS: 2 10*3/uL (ref 0.9–3.3)
Lymphocytes Relative: 39 %
MCH: 30.3 pg (ref 26.0–34.0)
MCHC: 32.7 g/dL (ref 32.0–36.0)
MCV: 92.6 fL (ref 81.0–101.0)
Monocytes Absolute: 0.3 10*3/uL (ref 0.1–0.9)
Monocytes Relative: 6 %
NEUTROS PCT: 52 %
Neutro Abs: 2.7 10*3/uL (ref 1.5–6.5)
Platelet Count: 311 10*3/uL (ref 145–400)
RBC: 4.33 MIL/uL (ref 3.70–5.32)
RDW: 13 % (ref 11.1–15.7)
WBC: 5.2 10*3/uL (ref 3.9–10.0)

## 2017-10-29 LAB — D-DIMER, QUANTITATIVE: D-Dimer, Quant: 2.66 ug/mL-FEU — ABNORMAL HIGH (ref 0.00–0.50)

## 2017-10-29 NOTE — Progress Notes (Signed)
Hematology and Oncology Follow Up Visit  Laurie Johnston [id number] [birth year] 57 y.o. 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 She did well with her total left knee replacement in March and has completed PT. She is now going to the gym 5-7 days a week to work out.  She is still a little sore and has some puffiness around the left knee. She will ice it 4-5 times a day as needed.   She takes Aleve or Tylenol as needed for pain and is needed will take Robaxin and/or Tramadol.  She stopped the Xarelto 20 mg on April 23rd.  No fever, chills, n/v, cough, rash, dizziness, SOB, chest pain, palpitations, abdominal pain or changes in bowel or bladder habits.  No numbness or tingling in her extremities.  No lymphadenopathy noted on exam.  No episodes of bleeding, no bruising or petechiae.  She has maintained a good appetite and is staying well hydrated. His weight is stable.   ECOG Performance Status: 1 - Symptomatic but completely ambulatory  Medications:  Allergies as of 10/29/2017      Reactions   Other       Medication List        Accurate as of 10/29/17 10:34 AM. Always use your most recent med list.          acetaminophen 500 MG tablet Commonly known as:  TYLENOL Take 500 mg by mouth as needed for pain.   ADVIL 200 MG Caps Generic drug:  Ibuprofen Take 200 mg by mouth as needed.   rivaroxaban 10 MG Tabs tablet Commonly known as:  XARELTO Take 1 tablet (10 mg total) by mouth daily.       Allergies:  Allergies  Allergen Reactions  . Other     Past Medical History, Surgical history, Social history, and Family History were reviewed and updated.  Review of Systems: All other 10 point review of systems is negative.   Physical Exam:  vitals were not taken for this  visit.   Wt Readings from Last 3 Encounters:  07/28/17 223 lb (101.2 kg)  12/28/12 199 lb (90.3 kg)    Ocular: Sclerae unicteric, pupils equal, round and reactive to light Ear-nose-throat: Oropharynx clear, dentition fair Lymphatic: No cervical, supraclavicular or axillary adenopathy Lungs no rales or rhonchi, good excursion bilaterally Heart regular rate and rhythm, no murmur appreciated Abd soft, nontender, positive bowel sounds, no liver or spleen tip palpated on exam, no fluid wave  MSK no focal spinal tenderness, no joint edema Neuro: non-focal, well-oriented, appropriate affect Breasts: Deferred   Lab Results  Component Value Date   WBC 5.2 10/29/2017   HGB 13.1 10/29/2017   HCT 40.1 10/29/2017   MCV 92.6 10/29/2017   PLT 311 10/29/2017   No results found for: FERRITIN, IRON, TIBC, UIBC, IRONPCTSAT Lab Results  Component Value Date   RBC 4.33 10/29/2017   No results found for: KPAFRELGTCHN, LAMBDASER, KAPLAMBRATIO No results found for: IGGSERUM, IGA, IGMSERUM No results found for: Odetta Pink, SPEI   Chemistry      Component Value Date/Time   NA 142 07/28/2017 1403   K 3.5 07/28/2017 1403   CL 106 07/28/2017 1403   CO2 30 07/28/2017 1403   BUN 17 07/28/2017 1403   CREATININE 0.60 07/28/2017 1403  Component Value Date/Time   CALCIUM 9.5 07/28/2017 1403   ALKPHOS 82 07/28/2017 1403   AST 25 07/28/2017 1403   ALT 26 07/28/2017 1403   BILITOT 0.6 07/28/2017 1403      Impression and Plan: Laurie Johnston is a very pleasant 57 yo caucasian female with prothrombin gene mutation, heterozygous and history of left lower extremity DVT. She did well with her left knee replacement and is healing nicely.  She had no issues while on Xarelto prior to and after surgery. So far there has been no evidence of recurrent thrombus.  We will plan to see her back in another 6 months for follow-up.  She will contact our office  with any questions or concerns. We can certianly see her sooner if need be.   Laverna Peace, NP 5/8/201910:34 AM

## 2018-11-20 DIAGNOSIS — M1712 Unilateral primary osteoarthritis, left knee: Secondary | ICD-10-CM | POA: Diagnosis not present

## 2022-10-09 ENCOUNTER — Ambulatory Visit (INDEPENDENT_AMBULATORY_CARE_PROVIDER_SITE_OTHER): Payer: BC Managed Care – PPO | Admitting: Podiatry

## 2022-10-09 ENCOUNTER — Ambulatory Visit (INDEPENDENT_AMBULATORY_CARE_PROVIDER_SITE_OTHER): Payer: BC Managed Care – PPO

## 2022-10-09 DIAGNOSIS — G5752 Tarsal tunnel syndrome, left lower limb: Secondary | ICD-10-CM

## 2022-10-09 DIAGNOSIS — M7662 Achilles tendinitis, left leg: Secondary | ICD-10-CM | POA: Diagnosis not present

## 2022-10-09 DIAGNOSIS — M79671 Pain in right foot: Secondary | ICD-10-CM

## 2022-10-09 MED ORDER — TRIAMCINOLONE ACETONIDE 10 MG/ML IJ SUSP
10.0000 mg | Freq: Once | INTRAMUSCULAR | Status: AC
Start: 2022-10-09 — End: 2022-10-09
  Administered 2022-10-09: 10 mg

## 2022-10-09 NOTE — Progress Notes (Signed)
Subjective:   Patient ID: Laurie Johnston, female   DOB: 62 y.o.   MRN: [id number]   HPI Patient was given a tentative diagnosis of tarsal tunnel left but is concerned that this may not be the problem and presents for second opinion.  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  Patient does not smoke likes to be active   Review of Systems  All other systems reviewed and are negative.       Objective:  Physical Exam Vitals and nursing note reviewed.  Constitutional:      Appearance: She is well-developed.  Pulmonary:     Effort: Pulmonary effort is normal.  Musculoskeletal:        General: Normal range of motion.  Skin:    General: Skin is warm.  Neurological:     Mental Status: She is alert.     Neurovascular status intact muscle strength adequate range of motion within normal limits with patient found to have discomfort in the left ankle but it is more at the Achilles tendon insertion medial side than it is within the tarsal canal and I did percuss this area and I was not able to create any shooting or radiating pain within this area.  Patient has good digital perfusion well-oriented x 3     Assessment:  I feel like there is a better chance that this may be medial Achilles tendinitis left without central lateral involvement versus tarsal tunnel even though I cannot rule that out     Plan:  H&P discussed at great length and at this point recommend an injection explaining risk of injection.  She wants to have this done I did sterile prep and I just injected the medial side 3 mg Dexasone Kenalog 5 mg Xylocaine keep it away from the central lateral and I then went ahead and applied air fracture walker to completely immobilize and take all stress off the foot that I would like her for her to wear for about 3  weeks.  Reappoint 4 weeks may require MRI depending on response  X-rays indicate a lot of roughened bone structure in the posterior aspect of the left heel no other pathology noted

## 2022-10-09 NOTE — Patient Instructions (Signed)

## 2022-11-06 ENCOUNTER — Encounter: Payer: Self-pay | Admitting: Podiatry

## 2022-11-06 ENCOUNTER — Ambulatory Visit (INDEPENDENT_AMBULATORY_CARE_PROVIDER_SITE_OTHER): Payer: BC Managed Care – PPO | Admitting: Podiatry

## 2022-11-06 DIAGNOSIS — M7662 Achilles tendinitis, left leg: Secondary | ICD-10-CM | POA: Diagnosis not present

## 2022-11-06 NOTE — Progress Notes (Addendum)
Subjective:   Patient ID: Laurie Johnston, female   DOB: 62 y.o.   MRN: [id number]   HPI Patient states the pain is still present it seems to have improved a little bit but I am still having problems   ROS      Objective:  Physical Exam  Neurovascular status intact discomfort still present on the lateral aspect of the left calcaneus posterior at the Achilles tendon insertion with reduced edema and boot which has reduced the stress on this area     Assessment:  Achilles tendinitis left improved but still present and has been present for a long time and she simply cannot do any form of activity     Plan:  Patient be reviewed and we are getting get an MRI to try to understand better the pathology associated with this condition.  She is from over 2 hours away so we get the results of that and I see what is the pathology the decision between PRP shockwave or possible surgery will be made based on what we find.  All of this was given to the patient she understands MRI ordered to try to understand why the pathology been present for so long and so painful

## 2022-11-13 ENCOUNTER — Encounter: Payer: Self-pay | Admitting: Podiatry

## 2022-11-15 ENCOUNTER — Encounter: Payer: Self-pay | Admitting: Podiatry

## 2022-11-21 ENCOUNTER — Encounter: Payer: Self-pay | Admitting: Podiatry

## 2022-11-28 ENCOUNTER — Encounter: Payer: Self-pay | Admitting: Podiatry

## 2022-12-01 ENCOUNTER — Ambulatory Visit
Admission: RE | Admit: 2022-12-01 | Discharge: 2022-12-01 | Disposition: A | Discharge status: Home / Self Care | Payer: BC Managed Care – PPO | Source: Ambulatory Visit | Attending: Podiatry | Admitting: Podiatry

## 2022-12-01 DIAGNOSIS — M7662 Achilles tendinitis, left leg: Secondary | ICD-10-CM

## 2022-12-05 ENCOUNTER — Encounter: Payer: Self-pay | Admitting: Podiatry
